# Patient Record
Sex: Male | Born: 1995 | Hispanic: No | Marital: Single | Smoking: Current every day smoker
Health system: Southern US, Community
[De-identification: ages and names within clinical notes are randomized; demographics above are authoritative.]

## PROBLEM LIST (undated history)

## (undated) DIAGNOSIS — J45909 Unspecified asthma, uncomplicated: Secondary | ICD-10-CM

## (undated) HISTORY — PX: BLADDER SURGERY: SHX569

---

## 2017-07-11 ENCOUNTER — Emergency Department (HOSPITAL_COMMUNITY): Payer: Self-pay

## 2017-07-11 ENCOUNTER — Emergency Department (HOSPITAL_COMMUNITY)
Admission: EM | Admit: 2017-07-11 | Discharge: 2017-07-12 | Disposition: A | Discharge status: Home / Self Care | Payer: Self-pay | Attending: Emergency Medicine | Admitting: Emergency Medicine

## 2017-07-11 ENCOUNTER — Encounter (HOSPITAL_COMMUNITY): Payer: Self-pay | Admitting: Emergency Medicine

## 2017-07-11 DIAGNOSIS — S61411A Laceration without foreign body of right hand, initial encounter: Secondary | ICD-10-CM | POA: Insufficient documentation

## 2017-07-11 DIAGNOSIS — Y929 Unspecified place or not applicable: Secondary | ICD-10-CM | POA: Insufficient documentation

## 2017-07-11 DIAGNOSIS — J45909 Unspecified asthma, uncomplicated: Secondary | ICD-10-CM | POA: Insufficient documentation

## 2017-07-11 DIAGNOSIS — Y999 Unspecified external cause status: Secondary | ICD-10-CM | POA: Insufficient documentation

## 2017-07-11 DIAGNOSIS — W260XXA Contact with knife, initial encounter: Secondary | ICD-10-CM | POA: Insufficient documentation

## 2017-07-11 DIAGNOSIS — F17210 Nicotine dependence, cigarettes, uncomplicated: Secondary | ICD-10-CM | POA: Insufficient documentation

## 2017-07-11 DIAGNOSIS — Y9389 Activity, other specified: Secondary | ICD-10-CM | POA: Insufficient documentation

## 2017-07-11 HISTORY — DX: Unspecified asthma, uncomplicated: J45.909

## 2017-07-11 MED ORDER — TETANUS-DIPHTH-ACELL PERTUSSIS 5-2.5-18.5 LF-MCG/0.5 IM SUSP
0.5000 mL | Freq: Once | INTRAMUSCULAR | Status: AC
  Administered 2017-07-11: 0.5 mL via INTRAMUSCULAR
  Filled 2017-07-11: qty 0.5

## 2017-07-11 MED ORDER — LIDOCAINE HCL (PF) 1 % IJ SOLN
5.0000 mL | Freq: Once | INTRAMUSCULAR | Status: AC
Start: 2017-07-11 — End: 2017-07-11
  Administered 2017-07-11: 5 mL
  Filled 2017-07-11: qty 5

## 2017-07-11 NOTE — ED Notes (Signed)
Right hand cleaned. Laceration tray to bedside

## 2017-07-11 NOTE — ED Provider Notes (Signed)
[facility]-EMERGENCY DEPT Provider Note   CSN: [id number] Arrival date & time: 07/11/17  2003  By signing my name below, I, Rosario Adie, attest that this documentation has been prepared under the direction and in the presence of Sharen Heck, PA-C.  Electronically Signed: Rosario Adie, ED Scribe. 07/11/17. 11:01 PM.  History   Chief Complaint Chief Complaint  Patient presents with  . Hand Injury   The history is provided by the patient. No language interpreter was used.    Bryan Roberts is a right-hand dominant 21 y.o. male who presents to the Emergency Department complaining of a wound sustained to the palmar right hand that occurred prior to arrival. Bleeding is controlled with pressure dressing. Pt reports that he was preparing dinner tonight and while opening a can of biscuits a sharp metal edge on the can came across the palmar surface of the right hand. He controlled the bleeding with a bandage prior to arrival; no other noted treatments were tried. He denies weakness, numbness, or any other associated symptoms. Tetanus is not UTD. He works at a [employer] and [employer].   Past Medical History:  Diagnosis Date  . Asthma    childhood   There are no active problems to display for this patient.  Past Surgical History:  Procedure Laterality Date  . BLADDER SURGERY      Home Medications    Prior to Admission medications   Not on File   Family History No family history on file.  Social History Social History  Substance Use Topics  . Smoking status: Current Every Day Smoker    Types: Cigarettes  . Smokeless tobacco: Never Used  . Alcohol use No   Allergies   Patient has no known allergies.  Review of Systems Review of Systems  Skin: Positive for wound.  Neurological: Negative for weakness and numbness.  All other systems reviewed and are negative.  Physical Exam Updated Vital Signs BP 117/73 (BP Location: Left Arm)   Pulse 60   Temp  97.9 F (36.6 C) (Oral)   Resp 18   Ht 6\' 4"  (1.93 m)   Wt 70.3 kg (155 lb)   SpO2 100%   BMI 18.87 kg/m   Physical Exam  Constitutional: He is oriented to person, place, and time. He appears well-developed and well-nourished. No distress.  NAD.  HENT:  Head: Normocephalic and atraumatic.  Right Ear: External ear normal.  Left Ear: External ear normal.  Nose: Nose normal.  Eyes: Conjunctivae and EOM are normal. No scleral icterus.  Neck: Normal range of motion. Neck supple.  Cardiovascular: Normal rate, regular rhythm, normal heart sounds and intact distal pulses.   No murmur heard. Pulses:      Radial pulses are 2+ on the right side, and 2+ on the left side.  Pulmonary/Chest: Effort normal and breath sounds normal. He has no wheezes.  Musculoskeletal: Normal range of motion. He exhibits no deformity.  Patient able to make full fist with right hand Full AROM of right hand and wrist Good thumb opposition to right hand No tenderness to anatomical snuffbox   Neurological: He is alert and oriented to person, place, and time.  Sensation to light touch intact in median, ulnar, radial nerve distribution to RUE Good hand grip bilaterally Good pincer strength in right hand  Skin: Skin is warm and dry. Capillary refill takes less than 2 seconds.  2cm laceration to the right thenar prominence, no surrounding erythema, edema. Appropriately tender.  Psychiatric: He has a normal mood and affect. His behavior is normal. Judgment and thought content normal.  Nursing note and vitals reviewed.  ED Treatments / Results  DIAGNOSTIC STUDIES: Oxygen Saturation is 100% on RA, normal by my interpretation.   COORDINATION OF CARE: 11:01 PM-Discussed next steps with pt. Pt verbalized understanding and is agreeable with the plan.   Labs (all labs ordered are listed, but only abnormal results are displayed) Labs Reviewed - No data to display  EKG  EKG Interpretation None       Radiology Dg Hand Complete Right  Result Date: 07/12/2017 CLINICAL DATA:  Laceration near first metacarpal. EXAM: RIGHT HAND - COMPLETE 3+ VIEW COMPARISON:  None. FINDINGS: Bandaging is noted overlying the first webspace of the right hand without radiopaque foreign body nor osseous involvement. 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 No acute fracture or dislocations. Carpal bones appear intact. IMPRESSION: 1. No acute osseous abnormality. 2. Old posttraumatic appearing deformity of the tuft of the right index finger and likely remote posttraumatic change involving the second MCP joint. 3. Bandaging is noted between the thumb and index finger limiting assessment however no radiopaque foreign body is identified. No underlying osseous involvement is seen. Electronically Signed   By: Tollie Eth M.D.   On: 07/12/2017 00:29    Procedures .Marland KitchenLaceration Repair Date/Time: 07/11/2017 11:01 PM Performed by: Liberty Handy Authorized by: Liberty Handy   Consent:    Consent obtained:  Verbal   Consent given by:  Patient   Risks discussed:  Infection, poor cosmetic result, pain, poor wound healing, need for additional repair and nerve damage   Alternatives discussed:  No treatment Universal protocol:    Procedure explained and questions answered to patient or proxy's satisfaction: yes     Relevant documents present and verified: yes     Test results available and properly labeled: yes     Imaging studies available: yes     Required blood products, implants, devices, and special equipment available: yes     Site/side marked: yes     Immediately prior to procedure, a time out was called: yes     Patient identity confirmed:  Verbally with patient, arm band and provided demographic data Anesthesia (see MAR for exact dosages):    Anesthesia method:   Local infiltration   Local anesthetic:  Lidocaine 1% w/o epi Laceration details:    Location:  Hand   Hand location:  R palm   Length (cm):  2 Repair type:    Repair type:  Simple Pre-procedure details:    Preparation:  Patient was prepped and draped in usual sterile fashion and imaging obtained to evaluate for foreign bodies Exploration:    Hemostasis achieved with:  Direct pressure   Wound exploration: wound explored through full range of motion and entire depth of wound probed and visualized     Wound extent: no muscle damage noted, no nerve damage noted, no tendon damage noted, no underlying fracture noted and no vascular damage noted     Contaminated: no   Treatment:    Area cleansed with:  Betadine   Amount of cleaning:  Standard   Irrigation method:  Tap   Visualized foreign bodies/material removed: no   Skin repair:    Repair method:  Sutures   Suture size:  5-0   Suture  technique:  Simple interrupted   Number of sutures:  3 Approximation:    Approximation:  Close   Vermilion border: well-aligned   Post-procedure details:    Dressing:  Antibiotic ointment and non-adherent dressing   Patient tolerance of procedure:  Tolerated well, no immediate complications     Medications Ordered in ED Medications  lidocaine (PF) (XYLOCAINE) 1 % injection 5 mL (5 mLs Infiltration Given 07/11/17 2343)  Tdap (BOOSTRIX) injection 0.5 mL (0.5 mLs Intramuscular Given 07/11/17 2343)    Initial Impression / Assessment and Plan / ED Course  I have reviewed the triage vital signs and the nursing notes.  Pertinent labs & imaging results that were available during my care of the patient were reviewed by me and considered in my medical decision making (see chart for details).     Patient is a 21 y.o. yo male that presents with laceration to palmar right hand. Tdap booster given. Pressure irrigation performed. Bottom of the wound visualized with bleeding controled, no foreign bodies seen.  No  tendon or nerve injury noted. Full ROM of affected extremity. Laceration occurred < 12 hours prior to repair which was well tolerated. Pt has no co morbidities to effect normal wound healing. Discussed suture home care with pt and answered questions. Pt to follow up for wound check and suture removal in 8-10 days. Pt is hemodynamically stable w no complaints prior to dc.    Final Clinical Impressions(s) / ED Diagnoses   Final diagnoses:  Laceration of right hand without foreign body, initial encounter   New Prescriptions New Prescriptions   No medications on file   I personally performed the services described in this documentation, which was scribed in my presence. The recorded information has been reviewed and is accurate.      Liberty HandyGibbons, Lorette Peterkin J, PA-C 07/12/17 0109    Charlynne PanderYao, David Hsienta, MD 07/13/17 [phone]

## 2017-07-11 NOTE — ED Triage Notes (Signed)
Pt states he punctured his right hand with a knife while trying to open a biscuit. Bleeding controlled at this time.

## 2017-07-12 NOTE — Discharge Instructions (Signed)
You were evaluated in the emergency department for a right hand laceration. X-rays were normal and did not show new bony injury or opaque foreign bodies. Your laceration was repaired with 3, nonabsorbable sutures which need to be removed in 8-10 days.   You may contact your primary care provider and make an appointment for a suture removal. Alternatively, you can present to urgent care or emergency department to get your sutures out within 10 days.   Leave your original dressing on for at minimum 24 hours. After 24 hours you may rinse laceration with clean water and mild soap at least twice daily and whenever dirty. Afterwards, you can apply a thin coat of antibacterial ointment and cover your laceration with a Band-Aid or a bulk your dressing. As best you can, avoid getting debris or dirt on your laceration to avoid infection. Avoid soaking or bathing (pool, ocean, lake) as this increases risk of infection.   Monitor for signs of infection or dehiscence including separation of the wound edges, increased swelling, redness, fevers, yellow or bloody discharge.

## 2017-07-12 NOTE — ED Notes (Signed)
ED Provider at bedside. 

## 2018-09-07 IMAGING — DX DG HAND COMPLETE 3+V*R*
3 series · 3 of 3 positions shown · non-contrast
Comparison: None.

CLINICAL DATA: Laceration near first metacarpal.

EXAM:
RIGHT HAND - COMPLETE 3+ VIEW

[x hand pa right]
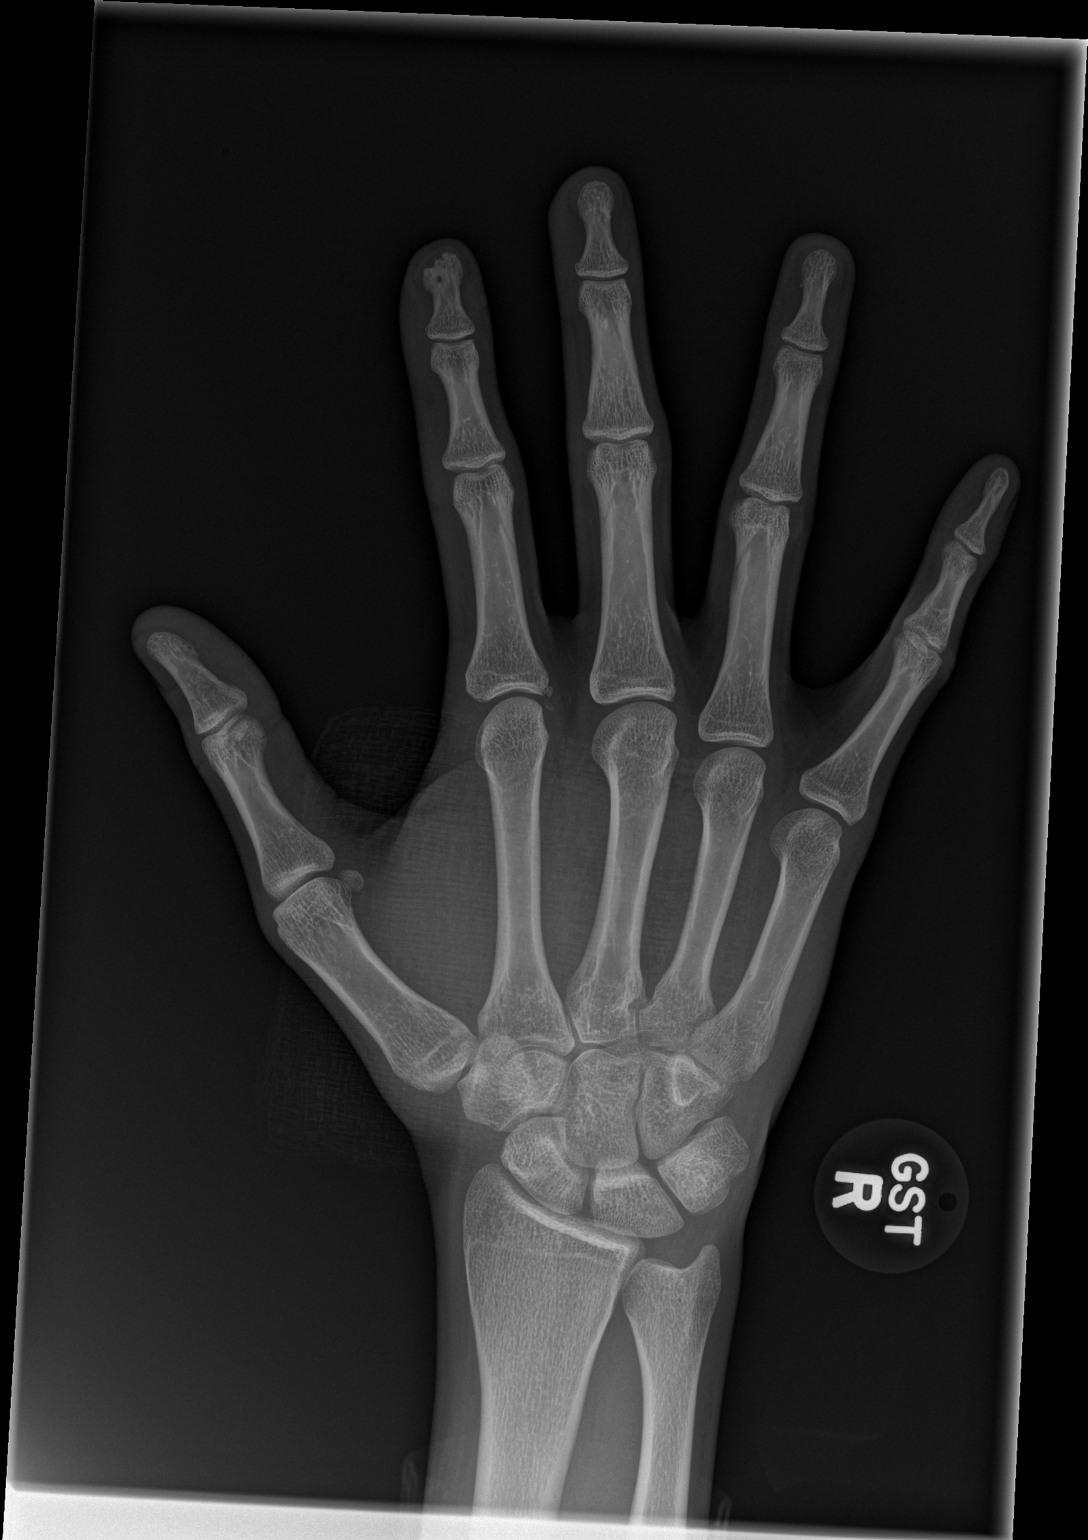

[x hand obl right]
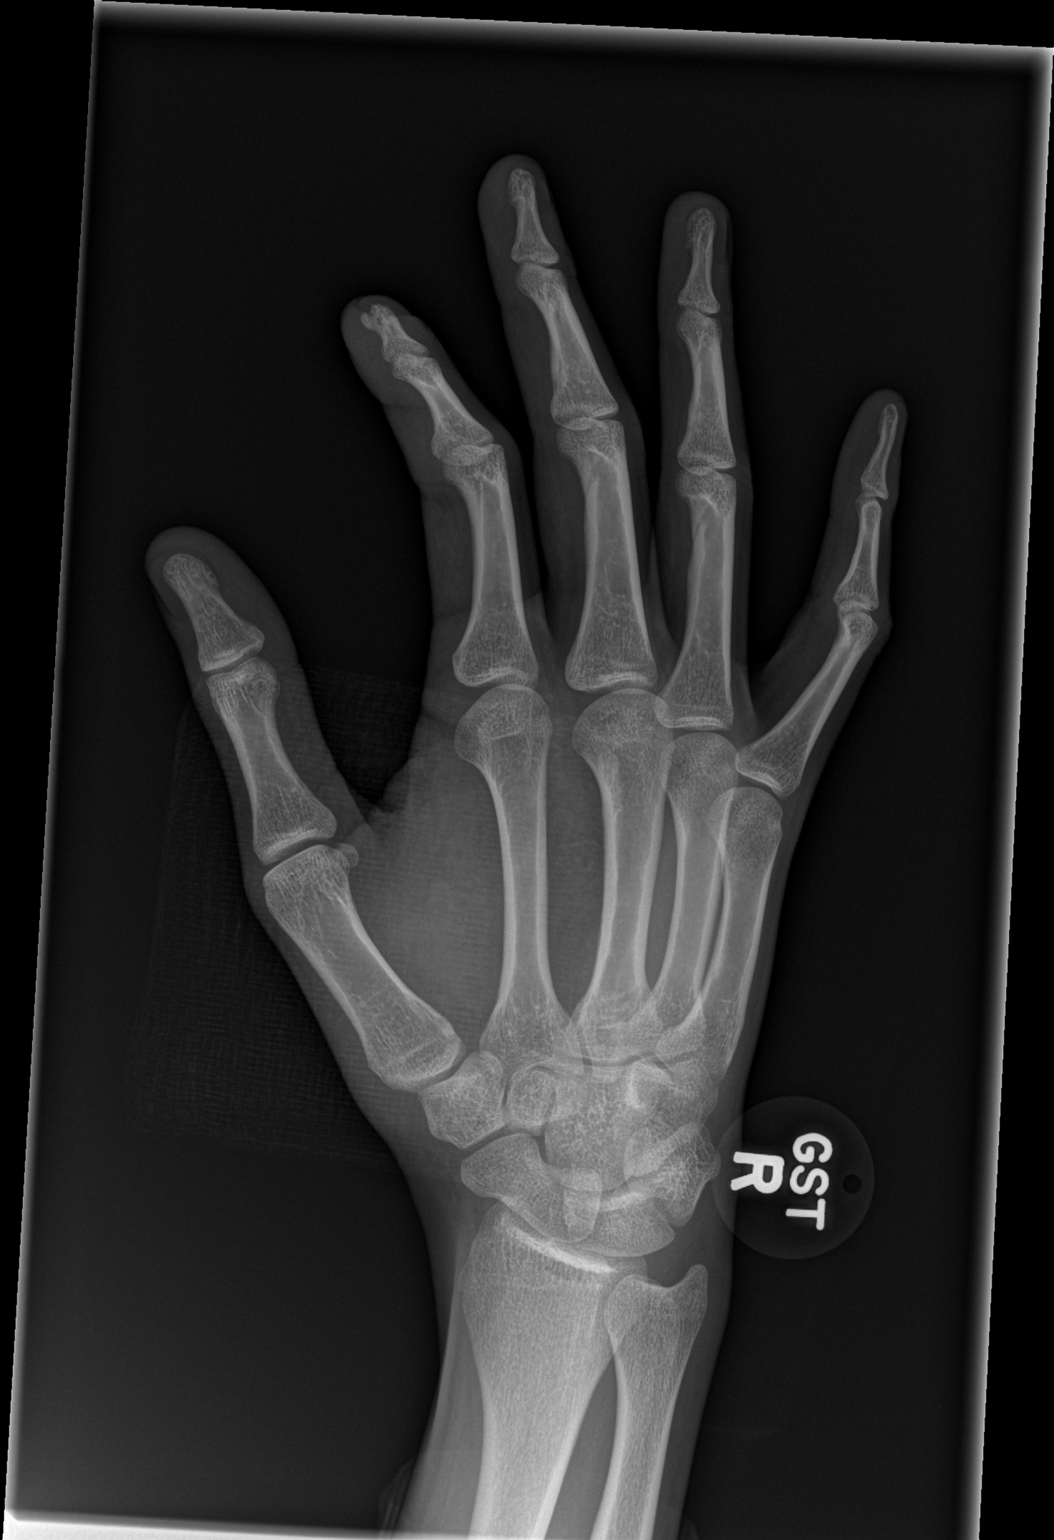

[x hand lat right]
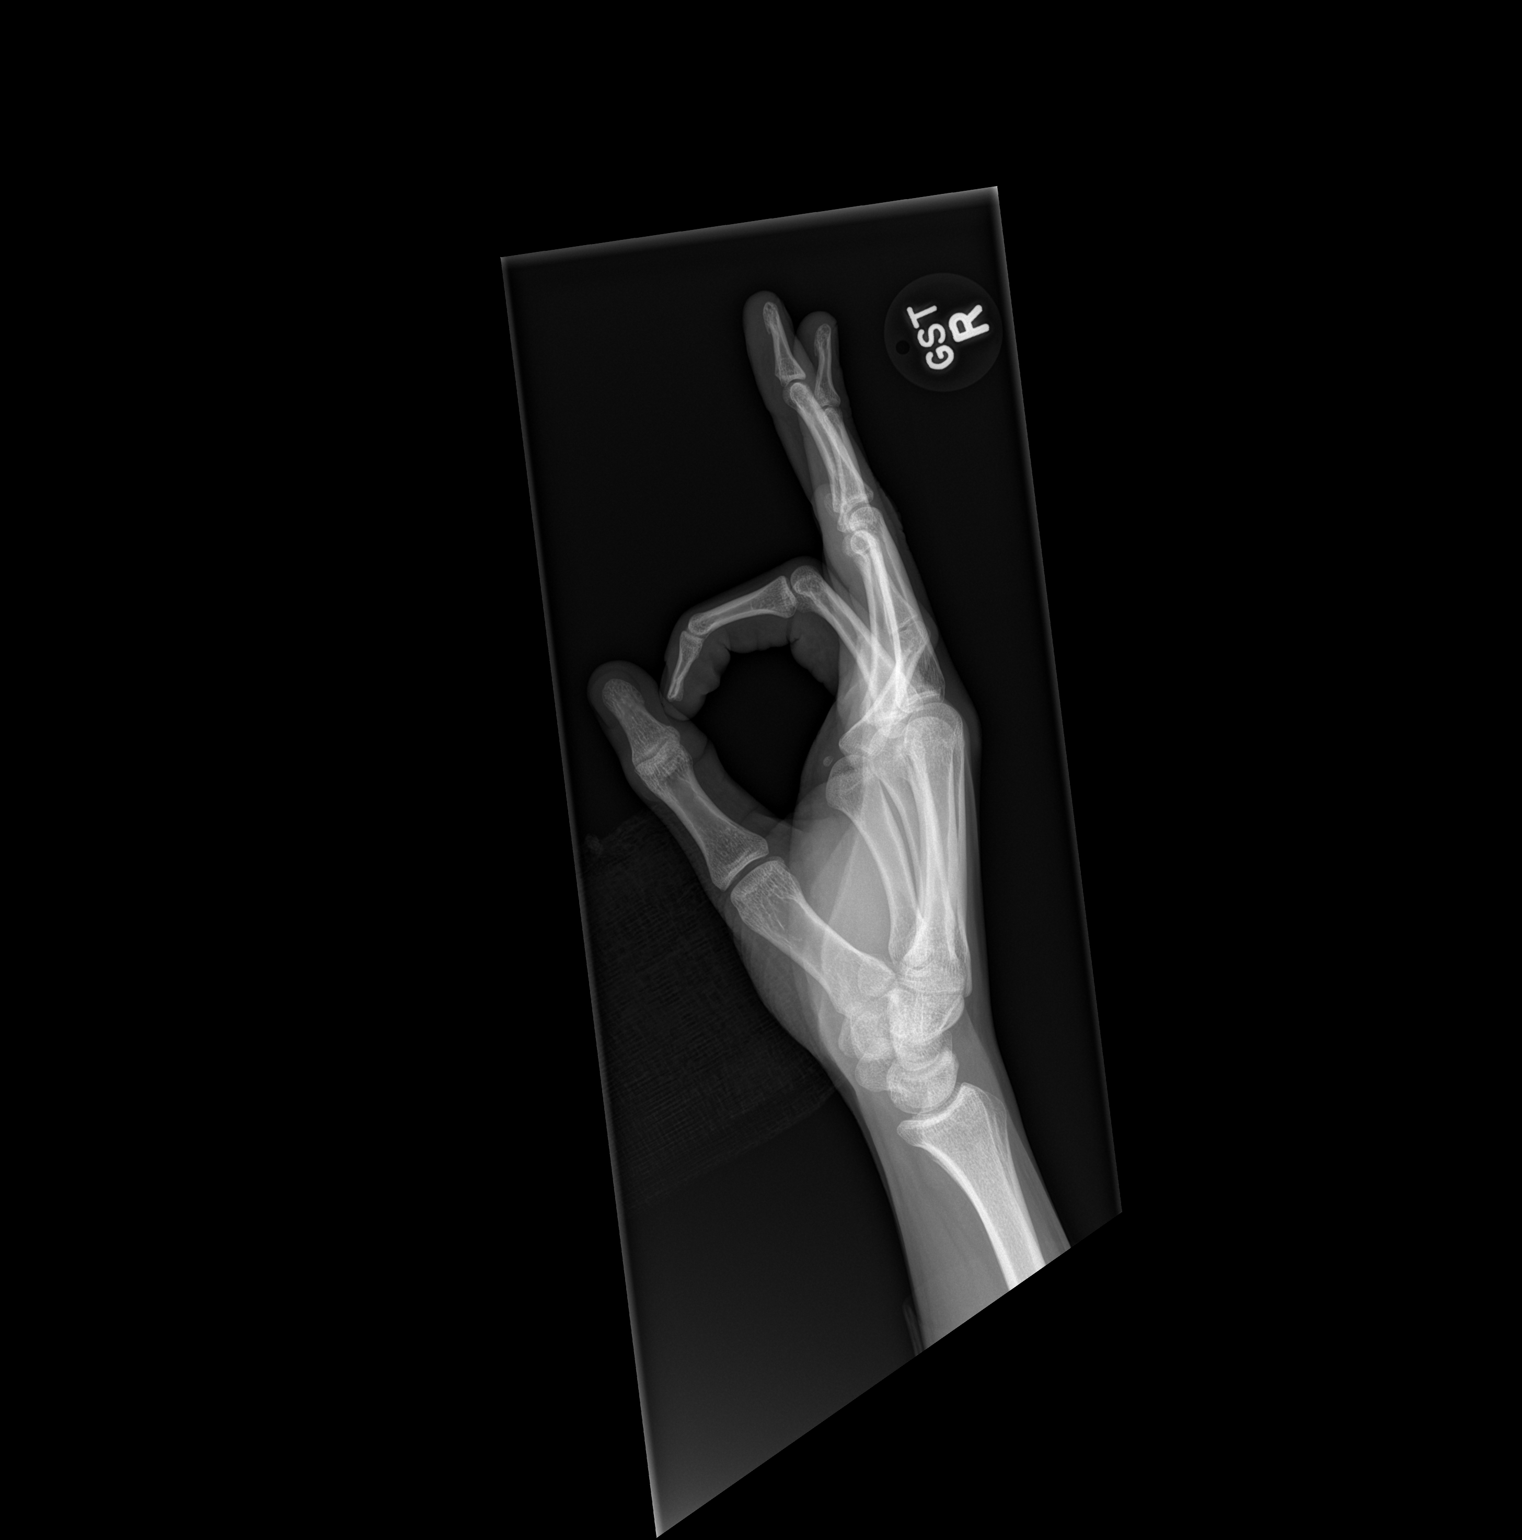

[3 of 3 positions shown; findings below may reference images not displayed]

FINDINGS: Bandaging is noted overlying the first webspace of the right hand
without radiopaque foreign body nor osseous involvement. Old
posttraumatic deformity of the tuft of the right index finger is
noted as well as along the ulnar aspect of the second MCP joint with
well corticated tiny ossific densities noted. No acute fracture or
dislocations. Carpal bones appear intact.
IMPRESSION: 1. No acute osseous abnormality.
2. Old posttraumatic appearing deformity of the tuft of the right
index finger and likely remote posttraumatic change involving the
second MCP joint.
3. Bandaging is noted between the thumb and index finger limiting
assessment however no radiopaque foreign body is identified. No
underlying osseous involvement is seen.
